# Patient Record
Sex: Male | Born: 1970 | Race: White | Hispanic: Yes | Marital: Married | Smoking: Never smoker
Health system: Southern US, Community
[De-identification: ages and names within clinical notes are randomized; demographics above are authoritative.]

## PROBLEM LIST (undated history)

## (undated) ENCOUNTER — Ambulatory Visit (HOSPITAL_COMMUNITY): Admission: EM | Payer: Self-pay | Source: Home / Self Care

## (undated) DIAGNOSIS — R200 Anesthesia of skin: Secondary | ICD-10-CM

## (undated) DIAGNOSIS — I10 Essential (primary) hypertension: Secondary | ICD-10-CM

## (undated) DIAGNOSIS — M25541 Pain in joints of right hand: Secondary | ICD-10-CM

## (undated) DIAGNOSIS — Z87442 Personal history of urinary calculi: Secondary | ICD-10-CM

## (undated) DIAGNOSIS — M25542 Pain in joints of left hand: Secondary | ICD-10-CM

## (undated) HISTORY — DX: Pain in joints of right hand: M25.541

## (undated) HISTORY — DX: Anesthesia of skin: R20.0

## (undated) HISTORY — DX: Pain in joints of right hand: M25.542

## (undated) HISTORY — DX: Personal history of urinary calculi: Z87.442

## (undated) HISTORY — DX: Essential (primary) hypertension: I10

---

## 2005-05-08 ENCOUNTER — Inpatient Hospital Stay (HOSPITAL_COMMUNITY): Admission: EM | Admit: 2005-05-08 | Discharge: 2005-05-09 | Payer: Self-pay | Admitting: Emergency Medicine

## 2005-05-09 ENCOUNTER — Ambulatory Visit: Payer: Self-pay | Admitting: Cardiology

## 2005-05-10 ENCOUNTER — Ambulatory Visit (HOSPITAL_COMMUNITY): Admission: RE | Admit: 2005-05-10 | Discharge: 2005-05-10 | Payer: Self-pay | Admitting: Cardiology

## 2005-06-08 ENCOUNTER — Ambulatory Visit: Payer: Self-pay | Admitting: Cardiology

## 2007-01-10 ENCOUNTER — Emergency Department (HOSPITAL_COMMUNITY): Admission: EM | Admit: 2007-01-10 | Discharge: 2007-01-10 | Payer: Self-pay | Admitting: Family Medicine

## 2014-10-26 ENCOUNTER — Emergency Department (HOSPITAL_COMMUNITY): Payer: Self-pay

## 2014-10-26 ENCOUNTER — Encounter (HOSPITAL_COMMUNITY): Payer: Self-pay | Admitting: Emergency Medicine

## 2014-10-26 ENCOUNTER — Emergency Department (HOSPITAL_COMMUNITY)
Admission: EM | Admit: 2014-10-26 | Discharge: 2014-10-27 | Discharge status: Against Medical Advice | Payer: Self-pay | Attending: Emergency Medicine | Admitting: Emergency Medicine

## 2014-10-26 DIAGNOSIS — R0602 Shortness of breath: Secondary | ICD-10-CM | POA: Insufficient documentation

## 2014-10-26 DIAGNOSIS — R61 Generalized hyperhidrosis: Secondary | ICD-10-CM | POA: Insufficient documentation

## 2014-10-26 DIAGNOSIS — R079 Chest pain, unspecified: Secondary | ICD-10-CM | POA: Insufficient documentation

## 2014-10-26 LAB — CBC
HCT: 44.9 % (ref 39.0–52.0)
Hemoglobin: 15.1 g/dL (ref 13.0–17.0)
MCH: 28.2 pg (ref 26.0–34.0)
MCHC: 33.6 g/dL (ref 30.0–36.0)
MCV: 83.9 fL (ref 78.0–100.0)
PLATELETS: 199 10*3/uL (ref 150–400)
RBC: 5.35 MIL/uL (ref 4.22–5.81)
RDW: 12.9 % (ref 11.5–15.5)
WBC: 7.6 10*3/uL (ref 4.0–10.5)

## 2014-10-26 LAB — BASIC METABOLIC PANEL
Anion gap: 9 (ref 5–15)
BUN: 17 mg/dL (ref 6–23)
CHLORIDE: 106 mmol/L (ref 96–112)
CO2: 24 mmol/L (ref 19–32)
CREATININE: 0.84 mg/dL (ref 0.50–1.35)
Calcium: 9.2 mg/dL (ref 8.4–10.5)
GFR calc Af Amer: >90 mL/min (ref >90)
Glucose, Bld: 110 mg/dL — ABNORMAL HIGH (ref 70–99)
Potassium: 3.8 mmol/L (ref 3.5–5.1)
Sodium: 139 mmol/L (ref 135–145)

## 2014-10-26 LAB — I-STAT TROPONIN, ED: TROPONIN I, POC: 0 ng/mL (ref 0.00–0.08)

## 2014-10-26 NOTE — ED Notes (Addendum)
Pt states that he had a sudden onset of chest pain at approx. 2 oclock today accompanied by sweating. Hx of same 7-8 years ago. Pt was supposed to have cardiac cath at that time but his pain was relieved with 'medicine' so they did not do it. Alert and oriented.

## 2014-10-27 LAB — TROPONIN I: Troponin I: <0.03 ng/mL (ref <0.031)

## 2014-10-27 MED ORDER — NITROGLYCERIN 0.4 MG SL SUBL
0.4000 mg | SUBLINGUAL_TABLET | SUBLINGUAL | Status: DC | PRN
  Administered 2014-10-27: 0.4 mg via SUBLINGUAL
  Filled 2014-10-27: qty 1

## 2014-10-27 MED ORDER — ASPIRIN 325 MG PO TABS
325.0000 mg | ORAL_TABLET | Freq: Once | ORAL | Status: AC
  Administered 2014-10-27: 325 mg via ORAL
  Filled 2014-10-27: qty 1

## 2014-10-27 NOTE — ED Notes (Signed)
MD at bedside. 

## 2014-10-27 NOTE — ED Provider Notes (Signed)
CSN: [id number]     Arrival date & time 10/26/14  1921 History   First MD Initiated Contact with Patient 10/27/14 0123     Chief Complaint  Patient presents with  . Chest Pain     (Consider location/radiation/quality/duration/timing/severity/associated sxs/prior Treatment) HPI  Dale Mueller is a 44 y.o. male with unknown past medical history presenting today with left-sided chest pain. 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 He had some excessive diaphoresis as well and mild shortness of breath. He denies emesis. The same thing happen several years ago but he was treated medically with nitroglycerin tablets. He did not have a catheterization at that time. Upon chart review, stress test was negative. He has not had follow-up since. He continues to have mild chest pain in the room. He has not had any fevers or coughing or recent infections. His sclerae has improved without any intervention but persists. Patient has no further complaints.  10 Systems reviewed and are negative for acute change except as noted in the HPI.      No past medical history on file. History reviewed. No pertinent past surgical history. No family history on file. History  Substance Use Topics  . Smoking status: Never Smoker   . Smokeless tobacco: Not on file  . Alcohol Use: No    Review of Systems    Allergies  Review of patient's allergies indicates no known allergies.  Home Medications   Prior to Admission medications   Not on File   BP 139/81 mmHg  Pulse 60  Temp(Src) 97.9 F (36.6 C) (Oral)  Resp 20  Ht  (1.803 m)  Wt 260 lb (117.935 kg)  BMI 36.28 kg/m2  SpO2 99% Physical Exam  Constitutional: He is oriented to person, place, and time. Vital signs are normal. He appears well-developed and well-nourished.  Non-toxic appearance. He does not appear ill. No distress.  HENT:  Head: Normocephalic and atraumatic.  Nose:  Nose normal.  Mouth/Throat: Oropharynx is clear and moist. No oropharyngeal exudate.  Eyes: Conjunctivae and EOM are normal. Pupils are equal, round, and reactive to light. No scleral icterus.  Neck: Normal range of motion. Neck supple. No tracheal deviation, no edema, no erythema and normal range of motion present. No thyroid mass and no thyromegaly present.  Cardiovascular: Normal rate, regular rhythm, S1 normal, S2 normal, normal heart sounds, intact distal pulses and normal pulses.  Exam reveals no gallop and no friction rub.   No murmur heard. Pulses:      Radial pulses are 2+ on the right side, and 2+ on the left side.       Dorsalis pedis pulses are 2+ on the right side, and 2+ on the left side.  Pulmonary/Chest: Effort normal and breath sounds normal. No respiratory distress. He has no wheezes. He has no rhonchi. He has no rales.  Abdominal: Soft. Normal appearance and bowel sounds are normal. He exhibits no distension, no ascites and no mass. There is no hepatosplenomegaly. There is no tenderness. There is no rebound, no guarding and no CVA tenderness.  Musculoskeletal: Normal range of motion. He exhibits no edema or tenderness.  Lymphadenopathy:    He has no cervical adenopathy.  Neurological: He is alert and oriented to person, place, and time. He has normal strength. No cranial nerve deficit or sensory deficit. He exhibits normal muscle tone.  Skin: Skin is warm, dry and intact. No petechiae  and no rash noted. He is not diaphoretic. No erythema. No pallor.  Psychiatric: He has a normal mood and affect. His behavior is normal. Judgment normal.  Nursing note and vitals reviewed.   ED Course  Procedures (including critical care time) Labs Review Labs Reviewed  BASIC METABOLIC PANEL - Abnormal; Notable for the following:    Glucose, Bld 110 (*)    All other components within normal limits  CBC  TROPONIN I  I-STAT TROPOININ, ED    Imaging Review Dg Chest 2 View  10/26/2014    CLINICAL DATA:  Acute onset left chest pain at 2 o'clock today.  EXAM: CHEST  2 VIEW  COMPARISON:  PA and lateral chest 05/08/2005.  FINDINGS: The lungs are clear. Heart size is normal. No pneumothorax or pleural effusion. No focal bony abnormality.  IMPRESSION: Negative chest.   Electronically Signed   By: Drusilla Kannerhomas  Dalessio M.D.   On: 10/26/2014 20:39     EKG Interpretation   Date/Time:  Monday October 26 2014 19:44:07 EDT Ventricular Rate:  69 PR Interval:  145 QRS Duration: 96 QT Interval:  377 QTC Calculation: 404 R Axis:   15 Text Interpretation:  Sinus rhythm Consider left atrial enlargement  Borderline T abnormalities, inferior leads No significant change since  last tracing Confirmed by KNAPP  MD-J, JON ([id number]) on 10/26/2014 7:47:11 PM      MDM   Final diagnoses:  Chest pain    Patient presents emergency department for chest pain. He states he does not have any significant past medical history. His story is concerning for possible ACS. Patient's symptoms began 12 hours ago, I'm obtaining a repeat troponin and EKG. 2 EKGs obtained during this visit did not show any significant change from his prior EKG. Due to the patient's history, I recommended for him to stay for further evaluation. Patient is leaving AGAINST MEDICAL ADVICE at this time. He does have decision-making capacity. He states that he feels it is more urgent for him to go back to work. Because of this, I did draw a repeat troponin that was negative. He was strongly advised to follow-up with his primary physician within 3 days which he stated it can be done. His vital signs remain within his normal limits and the patient was discharged from the emergency department AMA.    Tomasita CrumbleAdeleke Yoselyn Mcglade, MD 10/27/14 [phone]

## 2014-10-27 NOTE — ED Notes (Signed)
Patient c/o left chest pain, started today while working. Patient states pain is described as pressure with intermittent "stabbing" sensations.

## 2014-10-27 NOTE — Discharge Instructions (Signed)
Chest Pain (Nonspecific) Mr. Dale Mueller, your chest pain may be due to injury to your heart.  Follow-up with cardiology within 3 days for continued management. This is very important. If any symptoms worsen come back to emergency department immediately. Thank you.   [name] , su dolor de pecho puede ser debido a una lesin a su corazn . El seguimiento con la cardiologa plazo de 3 das para la gestin continua . Esto es Intel[employer] importante. Si los sntomas empeoran cualquier vuelven a departamento de emergencias. Gracias. It is often hard to give a diagnosis for the cause of chest pain. There is always a chance that your pain could be related to something serious, such as a heart attack or a blood clot in the lungs. You need to follow up with your doctor. HOME CARE  If antibiotic medicine was given, take it as directed by your doctor. Finish the medicine even if you start to feel better.  For the next few days, avoid activities that bring on chest pain. Continue physical activities as told by your doctor.  Do not use any tobacco products. This includes cigarettes, chewing tobacco, and e-cigarettes.  Avoid drinking alcohol.  Only take medicine as told by your doctor.  Follow your doctor's suggestions for more testing if your chest pain does not go away.  Keep all doctor visits you made. GET HELP IF:  Your chest pain does not go away, even after treatment.  You have a rash with blisters on your chest.  You have a fever. GET HELP RIGHT AWAY IF:   You have more pain or pain that spreads to your arm, neck, jaw, back, or belly (abdomen).  You have shortness of breath.  You cough more than usual or cough up blood.  You have very bad back or belly pain.  You feel sick to your stomach (nauseous) or throw up (vomit).  You have very bad weakness.  You pass out (faint).  You have chills. This is an emergency. Do not wait to see if the problems will go away. Call your local emergency  services (911 in [place] Do not drive yourself to the hospital. MAKE SURE YOU:   Understand these instructions.  Will watch your condition.  Will get help right away if you are not doing well or get worse. Document Released: 01/17/2008 Document Revised: 08/05/2013 Document Reviewed: 01/17/2008 [facility] Patient Information 2015 [employer] [place]. This information is not intended to replace advice given to you by your health care provider. Make sure you discuss any questions you have with your health care provider.

## 2014-10-28 ENCOUNTER — Encounter: Payer: Self-pay | Admitting: Cardiovascular Disease

## 2014-10-28 ENCOUNTER — Ambulatory Visit (INDEPENDENT_AMBULATORY_CARE_PROVIDER_SITE_OTHER): Payer: Self-pay | Admitting: Cardiovascular Disease

## 2014-10-28 VITALS — BP 115/80 | HR 62 | Ht 71.0 in | Wt 244.8 lb

## 2014-10-28 DIAGNOSIS — R0789 Other chest pain: Secondary | ICD-10-CM

## 2014-10-28 DIAGNOSIS — R079 Chest pain, unspecified: Secondary | ICD-10-CM | POA: Insufficient documentation

## 2014-10-28 LAB — LIPID PANEL
Cholesterol: 144 mg/dL (ref 0–200)
HDL: 28.6 mg/dL — AB (ref >39.00)
LDL Cholesterol: 94 mg/dL (ref 0–99)
NONHDL: 115.4
TRIGLYCERIDES: 107 mg/dL (ref 0.0–149.0)
Total CHOL/HDL Ratio: 5
VLDL: 21.4 mg/dL (ref 0.0–40.0)

## 2014-10-28 LAB — HEPATIC FUNCTION PANEL
ALT: 42 U/L (ref 0–53)
AST: 21 U/L (ref 0–37)
Albumin: 4.4 g/dL (ref 3.5–5.2)
Alkaline Phosphatase: 50 U/L (ref 39–117)
BILIRUBIN DIRECT: 0.2 mg/dL (ref 0.0–0.3)
BILIRUBIN TOTAL: 0.7 mg/dL (ref 0.2–1.2)
Total Protein: 7 g/dL (ref 6.0–8.3)

## 2014-10-28 NOTE — Patient Instructions (Signed)
Your physician recommends that you have lab work today: LIPID and LIVER  Your physician has requested that you have a stress echocardiogram. For further information please visit [email or web address]. Please follow instruction sheet as given.  Your physician recommends that you schedule a follow-up appointment as needed with Dr Excell Seltzerooper.

## 2014-10-28 NOTE — Progress Notes (Signed)
Cardiology Office Note   Date:  10/28/2014   ID:  Dale KernEverardo Mueller, DOB 04-02-1971, MRN [id number]  PCP:  No PCP Per Patient  Cardiologist:  Tonny BollmanMichael Naveen Lorusso, MD    Chief Complaint  Patient presents with  . Chest Pain     History of Present Illness: Dale Mueller is a 44 y.o. male who presents for evaluation of chest pain. He's had a 'hard' chest pain on the left side during work over the past week. There was associated diaphoresis. Also complains of fatigue, abnormal for him. Has also had some episodes of sharp chest pains over the past 2 weeks. He had similar sx's 8 years ago and had a nuclear stress test at that time.  Pain is not affected by position, movement, or deep inspiration.   The patient has been healthy. He has no medical problems, takes no medications, and denies any allergies. He has never had surgery, nor has he been hospitalized. He works for a [employer].   The patient's father had cardiac surgery in his 470s. The patient does not know details. His mother had heart problems in her 1760s. She is still living.   No past medical history on file.  No past surgical history on file.  No current outpatient prescriptions on file.   No current facility-administered medications for this visit.    Allergies:   Review of patient's allergies indicates no known allergies.   Social History:  The patient  reports that he has never smoked. He does not have any smokeless tobacco history on file. He reports that he does not drink alcohol or use illicit drugs.   Family History:  The patient's family history includes Heart attack in his father; Heart disease in his father; Hyperlipidemia in his father; Hypertension in his father.    ROS:  Please see the history of present illness.  Otherwise, review of systems is positive for chest pain, shortness of breath.  All other systems are reviewed and negative.    PHYSICAL EXAM: VS:  BP 115/80 mmHg  Pulse 62  Ht 5\' 11"   (1.803 m)  Wt 244 lb 12.8 oz (111.041 kg)  BMI 34.16 kg/m2 , BMI Body mass index is 34.16 kg/(m^2). GEN: Well nourished, well developed, in no acute distress HEENT: normal Neck: no JVD, no masses. No carotid bruits Cardiac: RRR without murmur or gallop                Respiratory:  clear to auscultation bilaterally, normal work of breathing GI: soft, nontender, nondistended, + BS MS: no deformity or atrophy Ext: no pretibial edema, pedal pulses 2+= bilaterally Skin: warm and dry, no rash Neuro:  Strength and sensation are intact Psych: euthymic mood, full affect  EKG:  EKG is ordered today. The ekg ordered today shows NSR 62 bpm, within normal limits.  Recent Labs: 10/26/2014: BUN 17; Creatinine 0.84; Hemoglobin 15.1; Platelets 199; Potassium 3.8; Sodium 139   Lipid Panel  No results found for: CHOL, TRIG, HDL, CHOLHDL, VLDL, LDLCALC, LDLDIRECT    Wt Readings from Last 3 Encounters:  10/28/14 244 lb 12.8 oz (111.041 kg)  10/26/14 260 lb (117.935 kg)     Cardiac Studies Reviewed: CXR: FINDINGS: The lungs are clear. Heart size is normal. No pneumothorax or pleural effusion. No focal bony abnormality.  IMPRESSION: Negative chest.  ASSESSMENT AND PLAN: 1.  Chest Pain with typical and atypical features. The patient has had some exertional symptoms. Also has chest wall tenderness with a suggestion of  either musculoskeletal pain or costochondritis. His lipid status is unknown. There is cardiac disease in his family in both parents. I would favor a functional study with a stress echocardiogram to evaluate exercise capacity and any potential ischemic changes.  I have reviewed the patient's records from emergency department. This includes review of his laboratory data, radiographic data, and EKGs. All studies were unrevealing for acute ischemia or infarction.  2. Lipids. No history of hyperlipidemia but I'm not sure that his cholesterol has ever been checked. Will draw a lipid panel  and LFTs today.  Current medicines are reviewed with the patient today.  The patient does not have concerns regarding medicines.  The following changes have been made:  See above  Labs/ tests ordered today include:  No orders of the defined types were placed in this encounter.    Disposition:   FU as needed stress test results  Signed, Tonny Bollman, MD  10/28/2014 9:57 AM    [facility] [address] [place], [place], [place]  [address] Phone: [phone]; Fax: [phone]

## 2014-11-03 ENCOUNTER — Ambulatory Visit (HOSPITAL_COMMUNITY): Payer: Self-pay | Attending: Cardiology

## 2014-11-03 DIAGNOSIS — R0789 Other chest pain: Secondary | ICD-10-CM | POA: Insufficient documentation

## 2014-11-03 DIAGNOSIS — R079 Chest pain, unspecified: Secondary | ICD-10-CM

## 2014-11-03 NOTE — Progress Notes (Signed)
Stress echo performed. 

## 2019-06-23 ENCOUNTER — Emergency Department (HOSPITAL_COMMUNITY)
Admission: EM | Admit: 2019-06-23 | Discharge: 2019-06-23 | Disposition: A | Discharge status: Home / Self Care | Payer: No Typology Code available for payment source | Attending: Emergency Medicine | Admitting: Emergency Medicine

## 2019-06-23 ENCOUNTER — Other Ambulatory Visit: Payer: Self-pay

## 2019-06-23 ENCOUNTER — Emergency Department (HOSPITAL_COMMUNITY): Payer: No Typology Code available for payment source

## 2019-06-23 ENCOUNTER — Encounter (HOSPITAL_COMMUNITY): Payer: Self-pay | Admitting: Emergency Medicine

## 2019-06-23 DIAGNOSIS — R310 Gross hematuria: Secondary | ICD-10-CM

## 2019-06-23 DIAGNOSIS — N200 Calculus of kidney: Secondary | ICD-10-CM

## 2019-06-23 DIAGNOSIS — R109 Unspecified abdominal pain: Secondary | ICD-10-CM

## 2019-06-23 DIAGNOSIS — R1032 Left lower quadrant pain: Secondary | ICD-10-CM | POA: Diagnosis present

## 2019-06-23 DIAGNOSIS — N202 Calculus of kidney with calculus of ureter: Secondary | ICD-10-CM | POA: Diagnosis not present

## 2019-06-23 LAB — CBC
HCT: 42.1 % (ref 39.0–52.0)
Hemoglobin: 13.9 g/dL (ref 13.0–17.0)
MCH: 28.7 pg (ref 26.0–34.0)
MCHC: 33 g/dL (ref 30.0–36.0)
MCV: 87 fL (ref 80.0–100.0)
Platelets: 165 10*3/uL (ref 150–400)
RBC: 4.84 MIL/uL (ref 4.22–5.81)
RDW: 12.3 % (ref 11.5–15.5)
WBC: 8.3 10*3/uL (ref 4.0–10.5)
nRBC: 0 % (ref 0.0–0.2)

## 2019-06-23 LAB — URINALYSIS, ROUTINE W REFLEX MICROSCOPIC
Bilirubin Urine: NEGATIVE
Glucose, UA: NEGATIVE mg/dL
Ketones, ur: NEGATIVE mg/dL
Leukocytes,Ua: NEGATIVE
Nitrite: NEGATIVE
Protein, ur: NEGATIVE mg/dL
RBC / HPF: >50 RBC/hpf — ABNORMAL HIGH (ref 0–5)
Specific Gravity, Urine: 1.013 (ref 1.005–1.030)
pH: 7 (ref 5.0–8.0)

## 2019-06-23 LAB — BASIC METABOLIC PANEL
Anion gap: 7 (ref 5–15)
BUN: 13 mg/dL (ref 6–20)
CO2: 25 mmol/L (ref 22–32)
Calcium: 8.4 mg/dL — ABNORMAL LOW (ref 8.9–10.3)
Chloride: 108 mmol/L (ref 98–111)
Creatinine, Ser: 0.88 mg/dL (ref 0.61–1.24)
GFR calc Af Amer: >60 mL/min (ref >60)
GFR calc non Af Amer: >60 mL/min (ref >60)
Glucose, Bld: 102 mg/dL — ABNORMAL HIGH (ref 70–99)
Potassium: 4.4 mmol/L (ref 3.5–5.1)
Sodium: 140 mmol/L (ref 135–145)

## 2019-06-23 MED ORDER — SODIUM CHLORIDE 0.9 % IV BOLUS
1000.0000 mL | Freq: Once | INTRAVENOUS | Status: AC
  Administered 2019-06-23: 1000 mL via INTRAVENOUS

## 2019-06-23 MED ORDER — OXYCODONE-ACETAMINOPHEN 5-325 MG PO TABS: 1.0000 | ORAL_TABLET | ORAL | 0 refills | Status: AC | PRN

## 2019-06-23 MED ORDER — KETOROLAC TROMETHAMINE 15 MG/ML IJ SOLN
15.0000 mg | Freq: Once | INTRAMUSCULAR | Status: AC
  Administered 2019-06-23: 15 mg via INTRAVENOUS
  Filled 2019-06-23: qty 1

## 2019-06-23 MED ORDER — TAMSULOSIN HCL 0.4 MG PO CAPS: 0.4000 mg | ORAL_CAPSULE | Freq: Every day | ORAL | 0 refills | Status: AC

## 2019-06-23 MED ORDER — CIPROFLOXACIN HCL 250 MG PO TABS: 250.0000 mg | ORAL_TABLET | Freq: Two times a day (BID) | ORAL | 0 refills | Status: AC

## 2019-06-23 NOTE — ED Provider Notes (Signed)
[facility]-EMERGENCY DEPT Provider Note   CSN: [id number] Arrival date & time: 06/23/19  7829     History   Chief Complaint Chief Complaint  Patient presents with   Flank Pain    HPI Dale Mueller is a 48 y.o. male.     48 y.o male with no PMH presents to the ED with a chief complaint of left flank pain x 3 days. Patient describes a sharp constant sensation to his left flank with radiation onto his left lower quadrant.  Reports on Friday he had some bread and since he woke up with this pain.  He has taken some Aleve for his pain which does help with his pain.  However, this morning he woke up and had gross hematuria with his morning void.  He also reports feeling some discomfort along the GU region, does not have any testicular swelling or testicular pain but has a feeling of fullness.  He also endorses a rash that began yesterday, the rash is located to the inner part of his bilateral thighs, this is non pruritic and non blanchable. He denies any fever, dysuria, nausea, anorexia, or vomiting. No prior surgical history to his abdomnen.   The history is provided by the patient.  Flank Pain Associated symptoms include abdominal pain. Pertinent negatives include no chest pain and no shortness of breath.    History reviewed. No pertinent past medical history.  Patient Active Problem List   Diagnosis Date Noted   Chest pain of uncertain etiology 10/28/2014    History reviewed. No pertinent surgical history.      Home Medications    Prior to Admission medications   Medication Sig Start Date End Date Taking? Authorizing Provider  ciprofloxacin (CIPRO) 250 MG tablet Take 1 tablet (250 mg total) by mouth 2 (two) times daily for 3 days. 06/23/19 06/26/19  Claude Manges, PA-C  oxyCODONE-acetaminophen (PERCOCET/ROXICET) 5-325 MG tablet Take 1 tablet by mouth every 4 (four) hours as needed for up to 3 days for severe pain. 06/23/19 06/26/19  Claude Manges, PA-C    tamsulosin (FLOMAX) 0.4 MG CAPS capsule Take 1 capsule (0.4 mg total) by mouth daily for 7 days. 06/23/19 06/30/19  Claude Manges, PA-C    Family History Family History  Problem Relation Age of Onset   Heart attack Father    Heart disease Father    Hyperlipidemia Father    Hypertension Father     Social History Social History   Tobacco Use   Smoking status: Never Smoker  Substance Use Topics   Alcohol use: No   Drug use: No     Allergies   Patient has no known allergies.   Review of Systems Review of Systems  Constitutional: Negative for chills and fever.  HENT: Negative for sinus pressure.   Respiratory: Negative for shortness of breath.   Cardiovascular: Negative for chest pain.  Gastrointestinal: Positive for abdominal pain. Negative for diarrhea, nausea and vomiting.  Genitourinary: Positive for flank pain and hematuria.  Skin: Positive for rash.     Physical Exam Updated Vital Signs BP (!) 146/96    Pulse 64    Temp 98.2 F (36.8 C) (Oral)    Resp 18    SpO2 97%   Physical Exam Vitals signs and nursing note reviewed.  Constitutional:      Appearance: Normal appearance.  HENT:     Head: Normocephalic and atraumatic.     Mouth/Throat:     Mouth: Mucous membranes are dry.  Eyes:     Pupils: Pupils are equal, round, and reactive to light.  Neck:     Musculoskeletal: Normal range of motion and neck supple.  Cardiovascular:     Rate and Rhythm: Normal rate.  Pulmonary:     Effort: Pulmonary effort is normal.     Breath sounds: No wheezing, rhonchi or rales.  Abdominal:     General: Bowel sounds are normal. There is distension.     Palpations: Abdomen is soft.     Tenderness: There is abdominal tenderness in the epigastric area, suprapubic area and left lower quadrant. There is left CVA tenderness. There is no right CVA tenderness, guarding or rebound. Negative signs include McBurney's sign.     Comments: TTP along the epigastric and LLQ. No  guarding, abdomen appears distended but soft.   Neurological:     Mental Status: He is alert and oriented to person, place, and time.            ED Treatments / Results  Labs (all labs ordered are listed, but only abnormal results are displayed) Labs Reviewed  URINALYSIS, ROUTINE W REFLEX MICROSCOPIC - Abnormal; Notable for the following components:      Result Value   Hgb urine dipstick MODERATE (*)    RBC / HPF >50 (*)    Bacteria, UA FEW (*)    All other components within normal limits  BASIC METABOLIC PANEL - Abnormal; Notable for the following components:   Glucose, Bld 102 (*)    Calcium 8.4 (*)    All other components within normal limits  CBC    EKG None  Radiology Ct Renal Stone Study  Result Date: 06/23/2019 CLINICAL DATA:  LEFT flank pain since Friday, new onset hematuria this morning, suspected stone disease EXAM: CT ABDOMEN AND PELVIS WITHOUT CONTRAST TECHNIQUE: Multidetector CT imaging of the abdomen and pelvis was performed following the standard protocol without IV contrast. Sagittal and coronal MPR images reconstructed from axial data set. Oral contrast not administered for this indication. COMPARISON:  None FINDINGS: Lower chest: Minimal dependent density at lung bases. Hepatobiliary: Fatty infiltration of liver. Gallbladder liver otherwise unremarkable Pancreas: Normal appearance Spleen: 7 mm nonspecific low-attenuation focus centrally within spleen. Otherwise normal appearance Adrenals/Urinary Tract: Tiny nonobstructing RIGHT renal calculus. Small cyst RIGHT kidney 2.5 x 2.5 cm image 35. Mild LEFT hydronephrosis and hydroureter secondary to a 3 mm distal LEFT ureteral calculus image 82. Minimal perinephric edema LEFT kidney. Bladder and RIGHT ureter unremarkable. Stomach/Bowel: Normal appendix. Stomach and bowel loops normal appearance Vascular/Lymphatic: Aorta normal caliber. No adenopathy. Reproductive: Unremarkable prostate gland and seminal vesicles Other:  Small LEFT inguinal hernia containing fat. No free air or free fluid. No additional inflammatory process. Musculoskeletal: Probable vertebral hemangioma at L3. IMPRESSION: Mild LEFT hydronephrosis and hydroureter secondary to a 3 mm distal LEFT ureteral calculus. Additional tiny nonobstructing RIGHT renal calculus and small RIGHT renal cyst. Fatty infiltration of liver. Small LEFT inguinal hernia containing fat. Electronically Signed   By: Ulyses SouthwardMark  Boles M.D.   On: 06/23/2019 12:03    Procedures Procedures (including critical care time)  Medications Ordered in ED Medications  sodium chloride 0.9 % bolus 1,000 mL (0 mLs Intravenous Stopped 06/23/19 1236)  ketorolac (TORADOL) 15 MG/ML injection 15 mg (15 mg Intravenous Given 06/23/19 1236)     Initial Impression / Assessment and Plan / ED Course  I have reviewed the triage vital signs and the nursing notes.  Pertinent labs & imaging results that were available during  my care of the patient were reviewed by me and considered in my medical decision making (see chart for details).       Patient presents with left flank pain for the past 3 days.  She reports some gross hematuria while attempting to void today.  During my evaluation patient is hemodynamically stable, afebrile.  CBC without any leukocytosis was obtained.  BMP showed a creatinine level within normal limits, no electrolyte derangement.  UA does show a moderate amount of hemoglobin.  6-10 white blood cell count, few bacteria.  Patient diagnoses included but not limited to diverticulitis, nephrolithiasis vs viral enteritis.  Of note, patient also voices a rash, suspect this is likely allergic in nature, no purpura, blistering, vesicles, will suspicion for any cellulitis.   CT renal showed: Mild LEFT hydronephrosis and hydroureter secondary to a 3 mm distal  LEFT ureteral calculus.    Additional tiny nonobstructing RIGHT renal calculus and small RIGHT  renal cyst.    Fatty  infiltration of liver.    Small LEFT inguinal hernia containing fat.   No fever, vomiting, leukocytosis low suspicion for any pyelonephritis at this time. These results have been discussed with patient.  He was given Toradol for his pain as he has a stable renal function.  He was advised to increase his fluids.  We will send him home with a short course of Flomax to help with stone passage, Percocet to help with his pain along with ciprofloxacin to treat him for any urinary tract infection.  Patient is agreeable of this, he will be provided with a copy of his CT in order to follow-up with urology.  Wife at the bedside understands and agrees with management.  I have discussed patient with Dr. Vallery Ridge who was also seen and evaluated patient and agrees with management.  Portions of this note were generated with Lobbyist. Dictation errors may occur despite best attempts at proofreading.  Final Clinical Impressions(s) / ED Diagnoses   Final diagnoses:  Gross hematuria  Left flank pain    ED Discharge Orders         Ordered    tamsulosin (FLOMAX) 0.4 MG CAPS capsule  Daily     06/23/19 1300    oxyCODONE-acetaminophen (PERCOCET/ROXICET) 5-325 MG tablet  Every 4 hours PRN     06/23/19 1300    ciprofloxacin (CIPRO) 250 MG tablet  2 times daily     06/23/19 1300           Janeece Fitting, PA-C 06/23/19 1304    Charlesetta Shanks, MD 06/23/19 1333

## 2019-06-23 NOTE — ED Notes (Signed)
Pt transported to CT ?

## 2019-06-23 NOTE — Discharge Instructions (Addendum)
Le he recetado medicina para Conservation officer, historic buildings, Percocet tome esta medicina para dolor severe.  La segunda medicine es un antibiotico, puede tomar 1 [employer] al dia.  La tercera medicina es Flomax, esta ayudara con pasar la piedra del rinon, tomela diaria. Advertencia esta medicina puede causar [employer].

## 2019-06-23 NOTE — ED Triage Notes (Signed)
Pt complaint of left flank since Friday; new onset hematuria this am.

## 2020-04-15 ENCOUNTER — Other Ambulatory Visit: Payer: Self-pay | Admitting: *Deleted

## 2020-04-15 ENCOUNTER — Encounter: Payer: Self-pay | Admitting: *Deleted

## 2020-04-16 ENCOUNTER — Ambulatory Visit: Payer: No Typology Code available for payment source | Admitting: Diagnostic Neuroimaging

## 2020-04-16 ENCOUNTER — Telehealth: Payer: Self-pay

## 2020-04-16 NOTE — Telephone Encounter (Signed)
Patient no showed 04/16/2020 new pt appt with Dr. Marjory Lies.

## 2020-04-20 ENCOUNTER — Encounter: Payer: Self-pay | Admitting: Diagnostic Neuroimaging

## 2021-04-06 ENCOUNTER — Other Ambulatory Visit: Payer: Self-pay

## 2021-04-06 ENCOUNTER — Encounter (HOSPITAL_COMMUNITY): Payer: Self-pay

## 2021-04-06 ENCOUNTER — Ambulatory Visit (INDEPENDENT_AMBULATORY_CARE_PROVIDER_SITE_OTHER): Payer: Self-pay

## 2021-04-06 ENCOUNTER — Ambulatory Visit (HOSPITAL_COMMUNITY)
Admission: EM | Admit: 2021-04-06 | Discharge: 2021-04-06 | Disposition: A | Discharge status: Home / Self Care | Payer: Self-pay | Attending: Internal Medicine | Admitting: Internal Medicine

## 2021-04-06 DIAGNOSIS — W19XXXA Unspecified fall, initial encounter: Secondary | ICD-10-CM

## 2021-04-06 DIAGNOSIS — S2241XA Multiple fractures of ribs, right side, initial encounter for closed fracture: Secondary | ICD-10-CM

## 2021-04-06 MED ORDER — TRAMADOL HCL 50 MG PO TABS: 50.0000 mg | ORAL_TABLET | Freq: Four times a day (QID) | ORAL | 0 refills | Status: AC | PRN

## 2021-04-06 MED ORDER — TIZANIDINE HCL 2 MG PO TABS: 2.0000 mg | ORAL_TABLET | Freq: Three times a day (TID) | ORAL | 0 refills | Status: AC | PRN

## 2021-04-06 NOTE — Discharge Instructions (Addendum)
-  You have two rib fractures: Acute fractures involve the anterior aspect of the right sixth and seventh ribs. -Rib fractures typically heal well on their own, but we will help control your pain while they heal -Please rest for the next 2 to 3 weeks, and follow-up with [place] or your primary care provider for a recheck before returning to full duty at work. -Tramadol for pain, up to every 6 hours.  This medication can cause drowsiness. -Start the muscle relaxer-Zanaflex (tizanidine), up to 3 times daily for muscle spasms and pain.  This can make you drowsy, so take at bedtime or when you do not need to drive or operate machinery. -For additional relief: -You can take Tylenol up to 1000 mg 3 times daily, and ibuprofen up to 800 mg 3 times daily with food.  You can take these together, or alternate every 3-4 hours. -Ice/heat.  Neither is better, but one might work better for you

## 2021-04-06 NOTE — ED Provider Notes (Addendum)
`` [facility]    CSN: [id number] Arrival date & time: 04/06/21  1205      History   Chief Complaint Chief Complaint  Patient presents with   Fall   rib cage pain    HPI Dale Mueller is a 50 y.o. male presenting with right-sided rib cage pain for about 8 days following a fall while running.  Medical history noncontributory.  States he tripped while running and fell directly onto the right side of his rib cage.  Pain since then, with minimal improvement and so he presented to the urgent care.  He is not on a blood thinner.  Denies hitting his head, loss of consciousness, headaches.  Denies pain or injury elsewhere.  Denies change in bowel or bladder function, denies hematuria. States the pain is worse with taking a deep breath, coughing, sneezing, laughing.  Denies shortness of breath, chest pain.  HPI  Past Medical History:  Diagnosis Date   Arthralgia of hands, bilateral    Hand numbness    History of kidney stones    Hypertension     Patient Active Problem List   Diagnosis Date Noted   Chest pain of uncertain etiology 10/28/2014    History reviewed. No pertinent surgical history.     Home Medications    Prior to Admission medications   Medication Sig Start Date End Date Taking? Authorizing Provider  Multiple Vitamin (MULTIVITAMIN) capsule Take 1 capsule by mouth daily.   Yes [provider]  tiZANidine (ZANAFLEX) 2 MG tablet Take 1 tablet (2 mg total) by mouth every 8 (eight) hours as needed for muscle spasms. 04/06/21  Yes Rhys Martini, PA-C  traMADol (ULTRAM) 50 MG tablet Take 1 tablet (50 mg total) by mouth every 6 (six) hours as needed. 04/06/21  Yes Rhys Martini, PA-C  meloxicam (MOBIC) 15 MG tablet Take 15 mg by mouth daily. 02/02/20   [provider]  olmesartan (BENICAR) 20 MG tablet Take by mouth. 01/27/20   [provider]    Family History Family History  Problem Relation Age of Onset   Heart attack Father     Heart disease Father    Hyperlipidemia Father    Hypertension Father     Social History Social History   Tobacco Use   Smoking status: Never   Smokeless tobacco: Never  Substance Use Topics   Alcohol use: No   Drug use: No     Allergies   Patient has no known allergies.   Review of Systems Review of Systems  Constitutional:  Negative for chills, fever and unexpected weight change.  Respiratory:  Negative for chest tightness and shortness of breath.   Cardiovascular:  Negative for chest pain and palpitations.  Gastrointestinal:  Negative for abdominal pain, diarrhea, nausea and vomiting.  Genitourinary:  Negative for decreased urine volume, difficulty urinating and frequency.  Musculoskeletal:  Positive for back pain. Negative for arthralgias, gait problem, joint swelling, myalgias, neck pain and neck stiffness.  Skin:  Negative for wound.  Neurological:  Negative for dizziness, tremors, seizures, syncope, facial asymmetry, speech difficulty, weakness, light-headedness, numbness and headaches.  All other systems reviewed and are negative.   Physical Exam Triage Vital Signs ED Triage Vitals  Enc Vitals Group     BP 04/06/21 1256 (!) 149/86     Pulse Rate 04/06/21 1256 69     Resp 04/06/21 1256 18     Temp 04/06/21 1256 98.1 F (36.7 C)  Temp Source 04/06/21 1256 Oral     SpO2 04/06/21 1256 97 %     Weight --      Height --      Head Circumference --      Peak Flow --      Pain Score 04/06/21 1254 8     Pain Loc --      Pain Edu? --      Excl. in GC? --    No data found.  Updated Vital Signs BP (!) 149/86 (BP Location: Right Arm)   Pulse 69   Temp 98.1 F (36.7 C) (Oral)   Resp 18   SpO2 97%   Visual Acuity Right Eye Distance:   Left Eye Distance:   Bilateral Distance:    Right Eye Near:   Left Eye Near:    Bilateral Near:     Physical Exam Vitals reviewed.  Constitutional:      General: He is not in acute distress.    Appearance: Normal  appearance. He is not ill-appearing.  HENT:     Head: Normocephalic and atraumatic.  Cardiovascular:     Rate and Rhythm: Normal rate and regular rhythm.     Heart sounds: Normal heart sounds.  Pulmonary:     Effort: Pulmonary effort is normal.     Breath sounds: Normal breath sounds and air entry.     Comments: Breath sounds full throughout Abdominal:     Tenderness: There is no abdominal tenderness. There is no right CVA tenderness, left CVA tenderness, guarding or rebound.  Musculoskeletal:     Cervical back: Normal range of motion. No swelling, deformity, signs of trauma, rigidity, spasms, tenderness, bony tenderness or crepitus. No pain with movement.     Thoracic back: No swelling, deformity, signs of trauma, spasms, tenderness or bony tenderness. Normal range of motion. No scoliosis.     Lumbar back: No swelling, deformity, signs of trauma, spasms, tenderness or bony tenderness. Normal range of motion. Negative right straight leg raise test and negative left straight leg raise test. No scoliosis.     Comments: R anterolateral 6th and 7th ribs with mild effusion and tenderness, without ecchymosis or obvious deformity. No paraspinous tenderness. No midline spinous tenderness, deformity, stepoff. No hip or pelvic instability.  Absolutely no other injury, deformity, tenderness, ecchymosis, abrasion.  Neurological:     General: No focal deficit present.     Mental Status: He is alert.     Cranial Nerves: No cranial nerve deficit.     Comments: CN 2-12 grossly intact.  Psychiatric:        Mood and Affect: Mood normal.        Behavior: Behavior normal.        Thought Content: Thought content normal.        Judgment: Judgment normal.     UC Treatments / Results  Labs (all labs ordered are listed, but only abnormal results are displayed) Labs Reviewed - No data to display  EKG   Radiology DG Ribs Unilateral W/Chest Right  Result Date: 04/06/2021 CLINICAL DATA:  Fall. EXAM:  RIGHT RIBS AND CHEST - 3+ VIEW COMPARISON:  10/26/2014 FINDINGS: There are acute fractures involving the anterior aspect of the right 6 and seventh ribs. Heart size appears normal. No pleural effusion or edema. No airspace opacities. No pneumothorax visualized. IMPRESSION: Acute fractures involve the anterior aspect of the right sixth and seventh ribs. Electronically Signed   By: Signa Kell M.D.   On: 04/06/2021  13:37    Procedures Procedures (including critical care time)  Medications Ordered in UC Medications - No data to display  Initial Impression / Assessment and Plan / UC Course  I have reviewed the triage vital signs and the nursing notes.  Pertinent labs & imaging results that were available during my care of the patient were reviewed by me and considered in my medical decision making (see chart for details).     This patient is a very pleasant 50 y.o. year old male presenting with R 6th and 7th rib fractures following fall that occurred 8 days ago.   Chest xray- Acute fractures involve the anterior aspect of the right sixth and seventh ribs.  Tramadol, zanaflex, tylenol/ibuprofen, rest.   ED return precautions discussed. Patient verbalizes understanding and agreement.   Level 4- acute complicated illness (fracture of multiple ribs) and prescription drug management.  Final Clinical Impressions(s) / UC Diagnoses   Final diagnoses:  Closed fracture of multiple ribs of right side, initial encounter  Fall, initial encounter     Discharge Instructions      -You have two rib fractures: Acute fractures involve the anterior aspect of the right sixth and seventh ribs. -Rib fractures typically heal well on their own, but we will help control your pain while they heal -Please rest for the next 2 to 3 weeks, and follow-up with [place] or your primary care provider for a recheck before returning to full duty at work. -Tramadol for pain, up to every 6 hours.  This medication can cause  drowsiness. -Start the muscle relaxer-Zanaflex (tizanidine), up to 3 times daily for muscle spasms and pain.  This can make you drowsy, so take at bedtime or when you do not need to drive or operate machinery. -For additional relief: -You can take Tylenol up to 1000 mg 3 times daily, and ibuprofen up to 800 mg 3 times daily with food.  You can take these together, or alternate every 3-4 hours. -Ice/heat.  Neither is better, but one might work better for you      ED Prescriptions     Medication Sig Dispense Auth. Provider   traMADol (ULTRAM) 50 MG tablet Take 1 tablet (50 mg total) by mouth every 6 (six) hours as needed. 12 tablet Rhys Martini, PA-C   tiZANidine (ZANAFLEX) 2 MG tablet Take 1 tablet (2 mg total) by mouth every 8 (eight) hours as needed for muscle spasms. 21 tablet Rhys Martini, PA-C      I have reviewed the PDMP during this encounter.   Rhys Martini, PA-C 04/06/21 1516    Rhys Martini, PA-C 04/06/21 2115

## 2021-04-06 NOTE — ED Triage Notes (Signed)
Pt report right sided rib cage pain x 8 days after he fell on the right side, Pt is worse when taking deep breath, cough, sneezing or laughing.

## 2022-03-09 ENCOUNTER — Ambulatory Visit: Payer: Self-pay | Admitting: Podiatry

## 2022-03-22 ENCOUNTER — Ambulatory Visit (INDEPENDENT_AMBULATORY_CARE_PROVIDER_SITE_OTHER): Payer: Self-pay

## 2022-03-22 ENCOUNTER — Ambulatory Visit (INDEPENDENT_AMBULATORY_CARE_PROVIDER_SITE_OTHER): Payer: Self-pay | Admitting: Podiatry

## 2022-03-22 ENCOUNTER — Encounter: Payer: Self-pay | Admitting: Podiatry

## 2022-03-22 DIAGNOSIS — M779 Enthesopathy, unspecified: Secondary | ICD-10-CM

## 2022-03-22 DIAGNOSIS — M722 Plantar fascial fibromatosis: Secondary | ICD-10-CM

## 2022-03-22 MED ORDER — TRIAMCINOLONE ACETONIDE 10 MG/ML IJ SUSP
10.0000 mg | Freq: Once | INTRAMUSCULAR | Status: AC
  Administered 2022-03-22: 10 mg

## 2022-03-22 MED ORDER — DICLOFENAC SODIUM 75 MG PO TBEC: 75.0000 mg | DELAYED_RELEASE_TABLET | Freq: Two times a day (BID) | ORAL | 2 refills | Status: AC

## 2022-03-22 NOTE — Progress Notes (Signed)
Subjective:   Patient ID: Dale Mueller, male   DOB: 51 y.o.   MRN: [id number]   HPI Patient presents with wife stating that he has had bad pain in his left heel for over a month.  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       Objective:  Physical Exam Vitals and nursing note reviewed.  Constitutional:      Appearance: He is well-developed.  Pulmonary:     Effort: Pulmonary effort is normal.  Musculoskeletal:        General: Normal range of motion.  Skin:    General: Skin is warm.  Neurological:     Mental Status: He is alert.     Neurovascular status intact muscle strength found be adequate range of motion within normal limits with patient found to have exquisite discomfort plantar aspect left heel at the insertional point tendon calcaneus with inflammation fluid and moderate high arch cavus foot structure     Assessment:  Acute fasciitis left with inflammation fluid of the medial band     Plan:  H&P reviewed condition sterile prep injected the plantar fascia left 3 mg Kenalog 5 mg Xylocaine applied fascial brace to lift up the arch placed on diclofenac reappoint 2 weeks  X-rays indicate relative high arch no indications of spur formation no sign stress fracture

## 2022-03-22 NOTE — Patient Instructions (Signed)

## 2022-03-28 ENCOUNTER — Telehealth: Payer: Self-pay | Admitting: *Deleted

## 2022-03-28 NOTE — Telephone Encounter (Signed)
Wife calling for medication location, explained that it was sent to pharmacy listed in chart, verbalized understanding and will check at that location.

## 2022-04-05 ENCOUNTER — Ambulatory Visit: Payer: Self-pay | Admitting: Podiatry

## 2022-05-03 ENCOUNTER — Encounter: Payer: Self-pay | Admitting: Podiatry

## 2022-05-03 ENCOUNTER — Ambulatory Visit (INDEPENDENT_AMBULATORY_CARE_PROVIDER_SITE_OTHER): Payer: Self-pay | Admitting: Podiatry

## 2022-05-03 DIAGNOSIS — M722 Plantar fascial fibromatosis: Secondary | ICD-10-CM

## 2022-05-03 MED ORDER — TRIAMCINOLONE ACETONIDE 10 MG/ML IJ SUSP
10.0000 mg | Freq: Once | INTRAMUSCULAR | Status: AC
  Administered 2022-05-03: 10 mg

## 2022-05-03 NOTE — Progress Notes (Signed)
Subjective:   Patient ID: Dale Mueller, male   DOB: 51 y.o.   MRN: [id number]   HPI Patient presents stating the left heel has been improved but still sore and states that he still has problems    ROS      Objective:  Physical Exam  Neurovascular status intact discomfort in the left heel improved from previous but still present     Assessment:  Planter fasciitis left improved but present     Plan:  H&P reviewed condition went ahead today and did 1 final injection sterile prep injected 3 mg Kenalog 5 mg Xylocaine and instructed on stretching exercises.  Reappoint to recheck

## 2023-06-02 IMAGING — DX DG RIBS W/ CHEST 3+V*R*
5 series · 5 of 5 positions shown · non-contrast
Comparison: 10/26/2014

CLINICAL DATA: Fall.

EXAM:
RIGHT RIBS AND CHEST - 3+ VIEW

[chest pa]
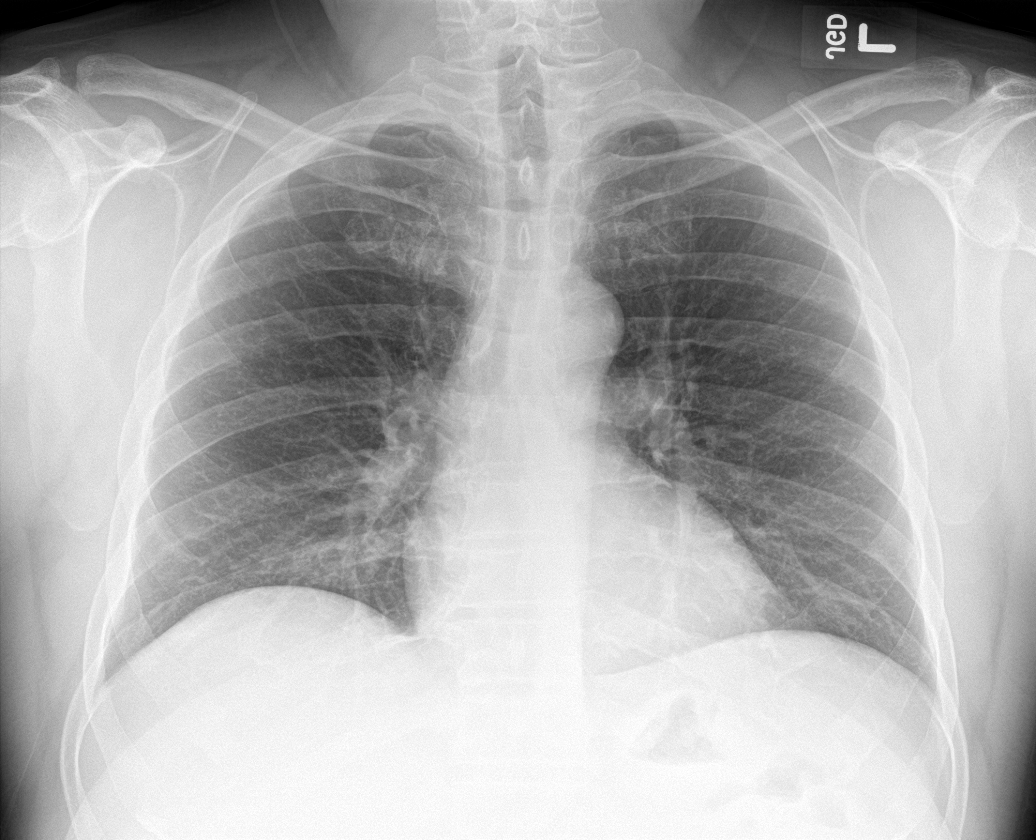

[rib obl (1 of 2)]
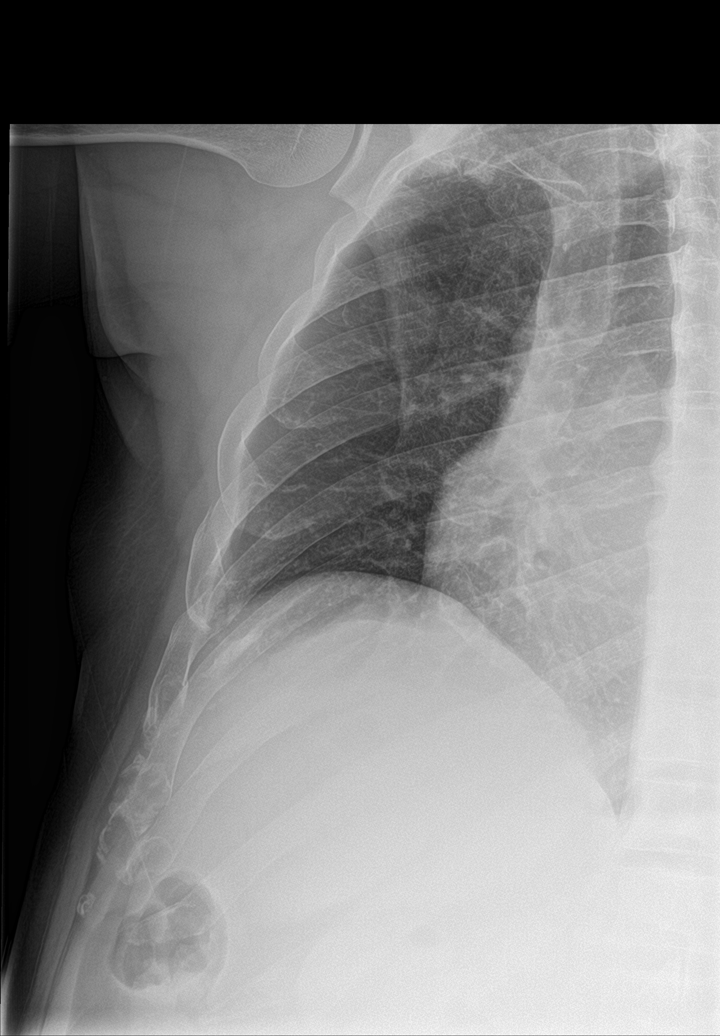

[rib obl (2 of 2)]
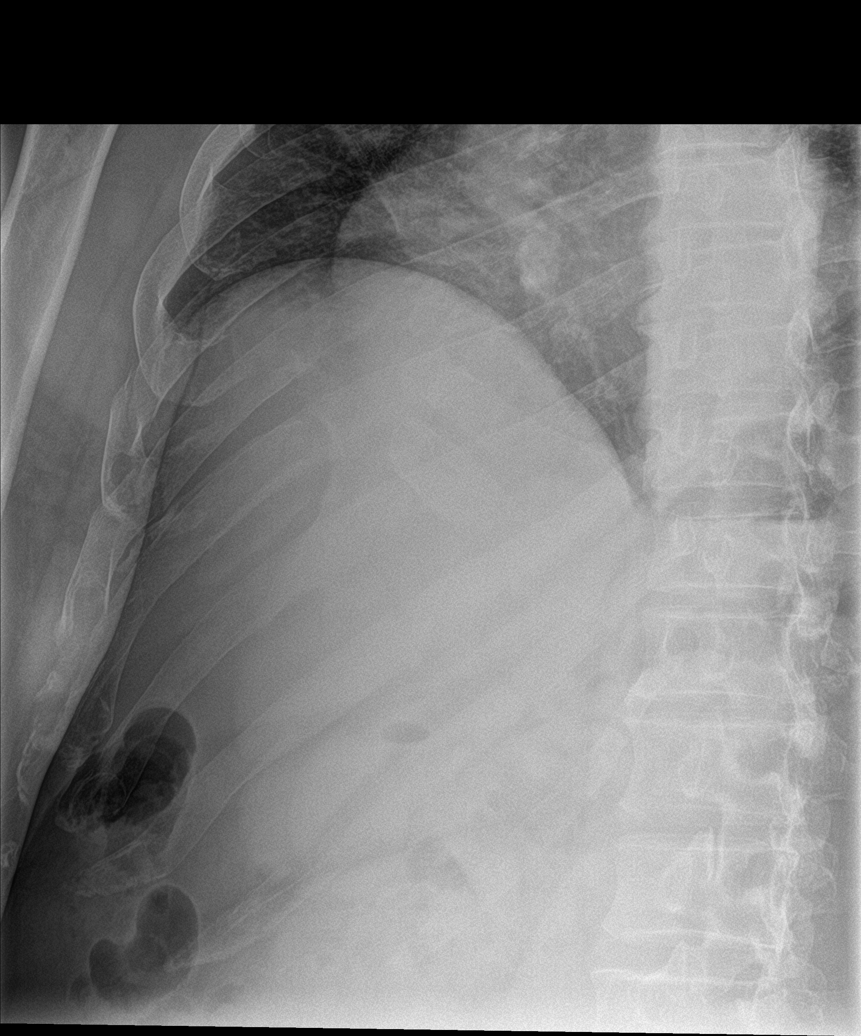

[rib ap (1 of 2)]
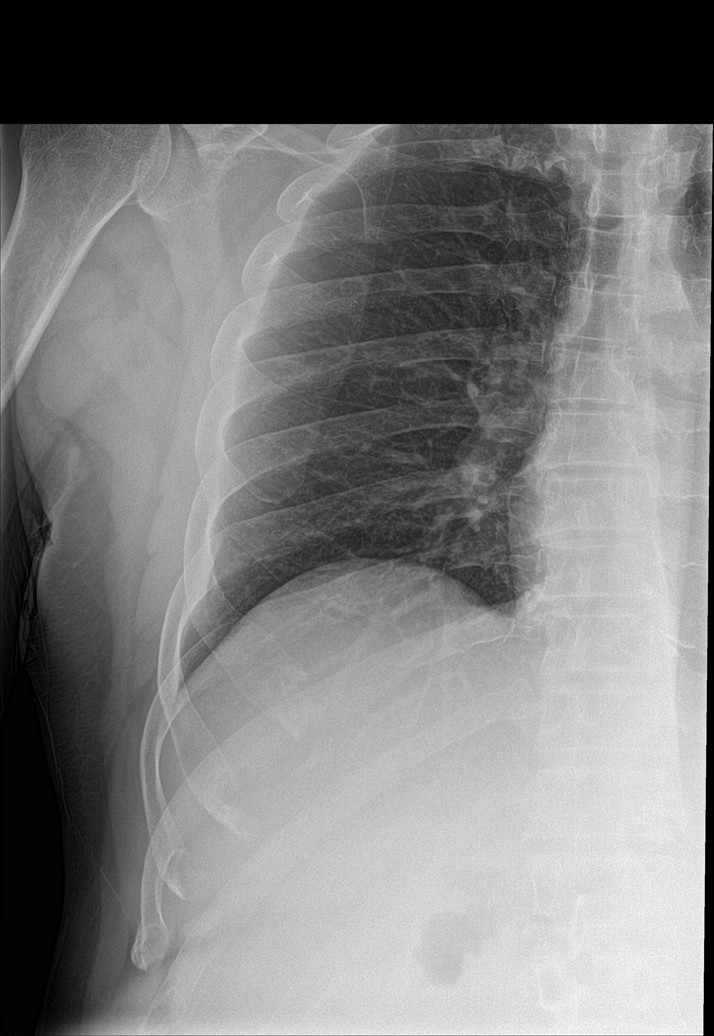

[rib ap (2 of 2)]
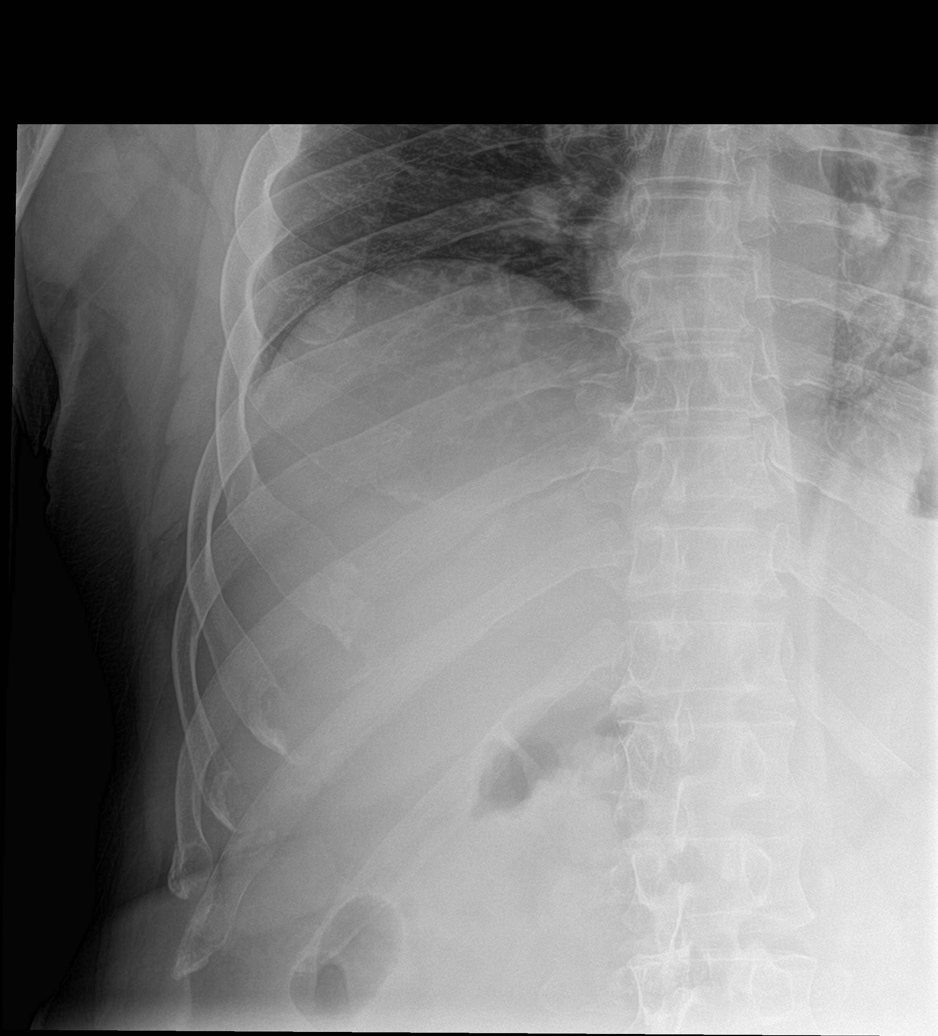

[5 of 5 positions shown; findings below may reference images not displayed]

FINDINGS: There are acute fractures involving the anterior aspect of the right
6 and seventh ribs. Heart size appears normal. No pleural effusion
or edema. No airspace opacities. No pneumothorax visualized.
IMPRESSION: Acute fractures involve the anterior aspect of the right sixth and
seventh ribs.
